# Patient Record
Sex: Female | Born: 1935 | Race: Asian | Hispanic: No | Smoking: Never smoker
Health system: Southern US, Community
[De-identification: ages and names within clinical notes are randomized; demographics above are authoritative.]

## PROBLEM LIST (undated history)

## (undated) DIAGNOSIS — K219 Gastro-esophageal reflux disease without esophagitis: Secondary | ICD-10-CM

## (undated) DIAGNOSIS — I10 Essential (primary) hypertension: Secondary | ICD-10-CM

## (undated) DIAGNOSIS — E7800 Pure hypercholesterolemia, unspecified: Secondary | ICD-10-CM

## (undated) HISTORY — PX: WRIST FRACTURE SURGERY: SHX121

---

## 2018-07-09 ENCOUNTER — Other Ambulatory Visit: Payer: Self-pay

## 2018-07-09 ENCOUNTER — Emergency Department (HOSPITAL_COMMUNITY): Payer: Medicare Other

## 2018-07-09 ENCOUNTER — Encounter (HOSPITAL_COMMUNITY): Payer: Self-pay

## 2018-07-09 ENCOUNTER — Emergency Department (HOSPITAL_COMMUNITY)
Admission: EM | Admit: 2018-07-09 | Discharge: 2018-07-09 | Disposition: A | Discharge status: Home / Self Care | Payer: Medicare Other | Attending: Emergency Medicine | Admitting: Emergency Medicine

## 2018-07-09 DIAGNOSIS — I10 Essential (primary) hypertension: Secondary | ICD-10-CM | POA: Diagnosis not present

## 2018-07-09 DIAGNOSIS — Z79899 Other long term (current) drug therapy: Secondary | ICD-10-CM | POA: Insufficient documentation

## 2018-07-09 DIAGNOSIS — E860 Dehydration: Secondary | ICD-10-CM | POA: Insufficient documentation

## 2018-07-09 DIAGNOSIS — R55 Syncope and collapse: Secondary | ICD-10-CM

## 2018-07-09 HISTORY — DX: Pure hypercholesterolemia, unspecified: E78.00

## 2018-07-09 HISTORY — DX: Essential (primary) hypertension: I10

## 2018-07-09 HISTORY — DX: Gastro-esophageal reflux disease without esophagitis: K21.9

## 2018-07-09 LAB — BASIC METABOLIC PANEL
ANION GAP: 9 (ref 5–15)
BUN: 19 mg/dL (ref 8–23)
CO2: 24 mmol/L (ref 22–32)
Calcium: 9.2 mg/dL (ref 8.9–10.3)
Chloride: 107 mmol/L (ref 98–111)
Creatinine, Ser: 0.92 mg/dL (ref 0.44–1.00)
GFR calc non Af Amer: 55 mL/min — ABNORMAL LOW (ref >60)
Glucose, Bld: 99 mg/dL (ref 70–99)
POTASSIUM: 3.8 mmol/L (ref 3.5–5.1)
SODIUM: 140 mmol/L (ref 135–145)

## 2018-07-09 LAB — CBC WITH DIFFERENTIAL/PLATELET
Abs Immature Granulocytes: 0 10*3/uL (ref 0.0–0.1)
BASOS ABS: 0 10*3/uL (ref 0.0–0.1)
Basophils Relative: 0 %
Eosinophils Absolute: 0.1 10*3/uL (ref 0.0–0.7)
Eosinophils Relative: 1 %
HCT: 41.9 % (ref 36.0–46.0)
Hemoglobin: 13.7 g/dL (ref 12.0–15.0)
IMMATURE GRANULOCYTES: 0 %
Lymphocytes Relative: 17 %
Lymphs Abs: 1.3 10*3/uL (ref 0.7–4.0)
MCH: 31.4 pg (ref 26.0–34.0)
MCHC: 32.7 g/dL (ref 30.0–36.0)
MCV: 95.9 fL (ref 78.0–100.0)
Monocytes Absolute: 0.5 10*3/uL (ref 0.1–1.0)
Monocytes Relative: 7 %
NEUTROS ABS: 5.4 10*3/uL (ref 1.7–7.7)
NEUTROS PCT: 75 %
Platelets: 229 10*3/uL (ref 150–400)
RBC: 4.37 MIL/uL (ref 3.87–5.11)
RDW: 12 % (ref 11.5–15.5)
WBC: 7.2 10*3/uL (ref 4.0–10.5)

## 2018-07-09 LAB — URINALYSIS, ROUTINE W REFLEX MICROSCOPIC
Bilirubin Urine: NEGATIVE
Glucose, UA: NEGATIVE mg/dL
Hgb urine dipstick: NEGATIVE
Ketones, ur: NEGATIVE mg/dL
LEUKOCYTES UA: NEGATIVE
NITRITE: NEGATIVE
Protein, ur: NEGATIVE mg/dL
Specific Gravity, Urine: 1.006 (ref 1.005–1.030)
pH: 7 (ref 5.0–8.0)

## 2018-07-09 LAB — TROPONIN I

## 2018-07-09 LAB — CBG MONITORING, ED: GLUCOSE-CAPILLARY: 89 mg/dL (ref 70–99)

## 2018-07-09 MED ORDER — SODIUM CHLORIDE 0.9 % IV BOLUS
500.0000 mL | Freq: Once | INTRAVENOUS | Status: AC
  Administered 2018-07-09: 500 mL via INTRAVENOUS

## 2018-07-09 MED ORDER — SODIUM CHLORIDE 0.9 % IV SOLN
INTRAVENOUS | Status: DC
  Administered 2018-07-09: 12:00:00 via INTRAVENOUS

## 2018-07-09 NOTE — ED Notes (Signed)
Patient transported to CT 

## 2018-07-09 NOTE — ED Notes (Signed)
Ambulated pt, down hallway and the pt was able to walk under her own power, felt like normal-per family. They stated that they think she didn't eat all day and feels weak as a direct result.

## 2018-07-09 NOTE — ED Provider Notes (Signed)
[facility] Provider Note   CSN: [id number] Arrival date & time: 07/09/18  1116     History   Chief Complaint Chief Complaint  Patient presents with  . Loss of Consciousness    HPI Rayne Dugnis Galambos is a 84 y.o. female.  HPI Patient presents to the emergency room for evaluation after a syncopal episode.  [place].  [place].  Patient states last evening at the wedding she had a near syncopal episode.  Patient started to feel lightheaded and felt like she was going to faint.  She was helped to the ground.  Patient did not lose consciousness.  Patient symptoms slowly resolved and she felt fine.  She did not have to leave the party.  This morning when the patient woke up she felt thirsty and wanted to get something to drink.  She was found at home on the ground where it looks like she had fallen off a chair.  When EMS arrived they noted that she was hypotensive with a blood pressure in the 80s.  Heart rate was 85-1 10.  Patient was given normal saline bolus with improvement.  Patient has a mild headache.  She denies any neck pain or chest pain.  No abdominal pain.  She has not had any vomiting or diarrhea.  No blood in her stool. Past Medical History:  Diagnosis Date  . GERD (gastroesophageal reflux disease)   . High cholesterol   . Hypertension     There are no active problems to display for this patient.   Past Surgical History:  Procedure Laterality Date  . WRIST FRACTURE SURGERY Right      OB History   None      Home Medications    Prior to Admission medications   Medication Sig Start Date End Date Taking? Authorizing Provider  amLODipine (NORVASC) 10 MG tablet Take 10 mg by mouth daily. 06/14/18  Yes [provider]  ELIQUIS 2.5 MG TABS tablet Take 2.5 mg by mouth 2 (two) times daily. 04/11/18  Yes [provider]  flecainide (TAMBOCOR) 50 MG tablet Take 50  mg by mouth 2 (two) times daily. 06/28/18  Yes [provider]  omeprazole (PRILOSEC) 40 MG capsule Take 40 mg by mouth daily. 04/21/18  Yes [provider]  simvastatin (ZOCOR) 10 MG tablet Take 10 mg by mouth daily. 07/02/18  Yes [provider]    Family History No family history on file.  Social History Social History   Tobacco Use  . Smoking status: Never Smoker  . Smokeless tobacco: Never Used  Substance Use Topics  . Alcohol use: Never    Frequency: Never  . Drug use: Never     Allergies   Patient has no known allergies.   Review of Systems Review of Systems  All other systems reviewed and are negative.    Physical Exam Updated Vital Signs BP 109/73   Pulse (!) 116   Temp 97.7 F (36.5 C) (Oral)   Resp (!) 23   Ht 1.524 m (5')   Wt 53.5 kg   SpO2 97%   BMI 23.05 kg/m   Physical Exam  Constitutional: She is oriented to person, place, and time. She appears well-developed and well-nourished. No distress.  HENT:  Head: Normocephalic and atraumatic.  Right Ear: External ear normal.  Left Ear: External ear normal.  Mouth/Throat: Oropharynx is clear and moist.  Mild  ttp left scalp  Eyes: Conjunctivae are normal. Right eye exhibits no discharge. Left eye exhibits no discharge. No scleral icterus.  Neck: Neck supple. No tracheal deviation present.  Cardiovascular: Normal rate, regular rhythm and intact distal pulses.  Pulmonary/Chest: Effort normal and breath sounds normal. No stridor. No respiratory distress. She has no wheezes. She has no rales.  Abdominal: Soft. Bowel sounds are normal. She exhibits no distension. There is no tenderness. There is no rebound and no guarding.  Musculoskeletal: She exhibits no edema or tenderness.  Neurological: She is alert and oriented to person, place, and time. She has normal strength. No cranial nerve deficit (No facial droop, extraocular movements intact, tongue midline ) or sensory deficit. She  exhibits normal muscle tone. She displays no seizure activity. Coordination normal.  No pronator drift bilateral upper extrem, able to hold both legs off bed for 5 seconds, sensation intact in all extremities, no visual field cuts, no left or right sided neglect, normal finger-nose exam bilaterally, no nystagmus noted   Skin: Skin is warm and dry. No rash noted.  Psychiatric: She has a normal mood and affect.  Nursing note and vitals reviewed.    ED Treatments / Results  Labs (all labs ordered are listed, but only abnormal results are displayed) Labs Reviewed  BASIC METABOLIC PANEL - Abnormal; Notable for the following components:      Result Value   GFR calc non Af Amer 55 (*)    All other components within normal limits  URINALYSIS, ROUTINE W REFLEX MICROSCOPIC - Abnormal; Notable for the following components:   Color, Urine STRAW (*)    All other components within normal limits  CBC WITH DIFFERENTIAL/PLATELET  TROPONIN I  CBG MONITORING, ED    EKG EKG Interpretation  Date/Time:  Sunday July 09 2018 11:26:18 EDT Ventricular Rate:  90 PR Interval:    QRS Duration: 91 QT Interval:  366 QTC Calculation: 448 R Axis:   16 Text Interpretation:  Atrial fibrillation Nonspecific repol abnormality, diffuse leads No old tracing to compare Confirmed by Linwood Dibbles [phone]) on 07/09/2018 11:33:31 AM   Radiology Ct Head Wo Contrast  Result Date: 07/09/2018 CLINICAL DATA:  Pt had syncopal episode with positive LOC. Pt hit left side of head and has hematoma to left occipital area. Pt denies being on blood thinners, EXAM: CT HEAD WITHOUT CONTRAST TECHNIQUE: Contiguous axial images were obtained from the base of the skull through the vertex without intravenous contrast. COMPARISON:  None. FINDINGS: Brain: No evidence of acute infarction, hemorrhage, hydrocephalus, extra-axial collection or mass lesion/mass effect. Mild subcortical white matter hypoattenuation consistent with chronic  microvascular ischemic change. Mild volume loss with normal range for age. Vascular: No hyperdense vessel or unexpected calcification. Skull: Normal. Negative for fracture or focal lesion. Sinuses/Orbits: Visualized globes and orbits are unremarkable visualized sinuses and mastoid air cells are clear. Other: None IMPRESSION: 1. No acute intracranial abnormalities. 2. Mild chronic microvascular ischemic change. Electronically Signed   By: Amie Portland M.D.   On: 07/09/2018 12:22    Procedures Procedures (including critical care time)  Medications Ordered in ED Medications  sodium chloride 0.9 % bolus 500 mL (0 mLs Intravenous Stopped 07/09/18 1320)    And  0.9 %  sodium chloride infusion ( Intravenous New Bag/Given 07/09/18 1219)  sodium chloride 0.9 % bolus 500 mL (0 mLs Intravenous Stopped 07/09/18 1608)     Initial Impression / Assessment and Plan / ED Course  I have reviewed the triage vital signs  and the nursing notes.  Pertinent labs & imaging results that were available during my care of the patient were reviewed by me and considered in my medical decision making (see chart for details).  Patient presented to the emergency room for evaluation after a syncopal episode.  Patient is here in [place] visiting for her grandsons wedding.  Patient does not think she ate or drank normally last evening.  She was very hot and may have gotten dehydrated.  This morning when she woke up she was thirsty and going to get something to drink when she had another episode.  Patient does have known A. fib.  Her EKG did show atrial fibrillation.  Her ED work-up is reassuring.  No signs of severe dehydration.  Cardiac enzymes are normal.  CT scan does not show any acute injuries.  Patient was noted to be orthostatic.  She was given IV fluids and her symptoms have improved.  I discussed admitting her to the hospital for observation considering her age and the recurrent episodes.  Patient did not want to be  admitted to the hospital.  She was feeling better.  She and her family are comfortable with being discharged home at this point.  She was able to walk around the emergency room without any recurrent dizziness or lightheadedness.  Final Clinical Impressions(s) / ED Diagnoses   Final diagnoses:  Dehydration  Syncope, unspecified syncope type    ED Discharge Orders    None       Linwood Dibbles, MD 07/09/18 1645

## 2018-07-09 NOTE — ED Notes (Signed)
Pt requested to walk, did ortho vitals and pt became dizzy, notified MD. VS validated. BP dropped to 98

## 2018-07-09 NOTE — ED Notes (Signed)
ED Provider at bedside. 

## 2018-07-09 NOTE — Discharge Instructions (Addendum)
Drink plenty of fluids, follow up with your cardiologist when you return home, return for worsening symptoms

## 2018-07-09 NOTE — ED Triage Notes (Signed)
[facility] after syncopal episode with positive LOC. Pt hit left side of head and has hematoma to left occipital area. Pt denies being on blood thinners, A&O upon arrival to ED. Pt had syncopal episode last night, was evaluated by family, and given fluids. Pt had 2nd syncopal episode today. EMS reports initial HR 85-110 with BP of 82/46. After 250 NS IV, HR slowed to 65-84 with pressure of 123/66 sitting and then 124/75 standing. When standing HR elevated to 118.

## 2019-10-16 IMAGING — CT CT HEAD W/O CM
3 series · 15 of 47 positions shown, 18 images · non-contrast
Comparison: None.

CLINICAL DATA: Pt had syncopal episode with positive LOC. Pt hit
left side of head and has hematoma to left occipital area. Pt denies
being on blood thinners,

EXAM:
CT HEAD WITHOUT CONTRAST
TECHNIQUE: Contiguous axial images were obtained from the base of the skull
through the vertex without intravenous contrast.

[Series 3: head 5.0 h30s · axial · 0.39mm/px · z∈[-135,-10]mm · 9 of 31 slices shown, 12 images]
[im 3/31  brain]
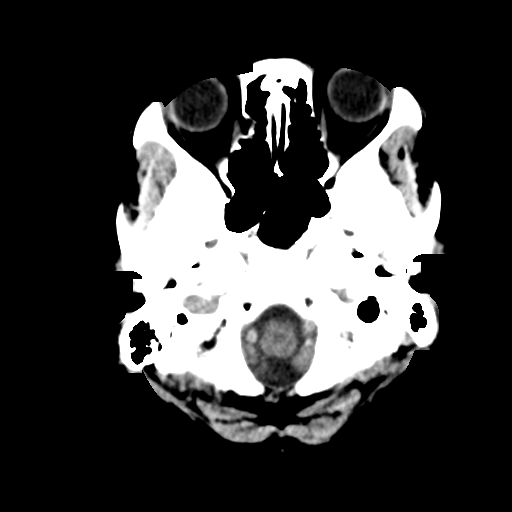
[im 3/31  bone]
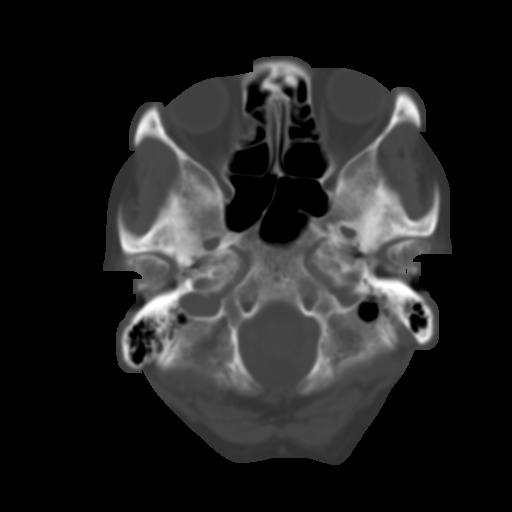
[im 6/31  brain]
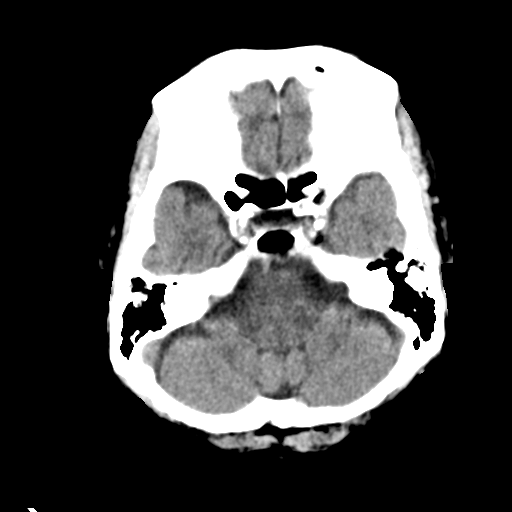
[im 9/31  brain]
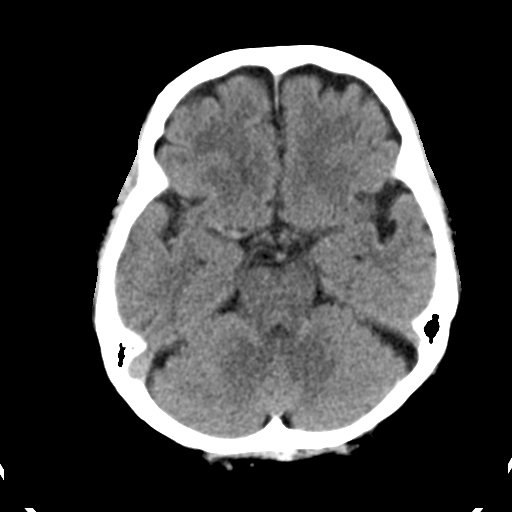
[im 12/31  brain]
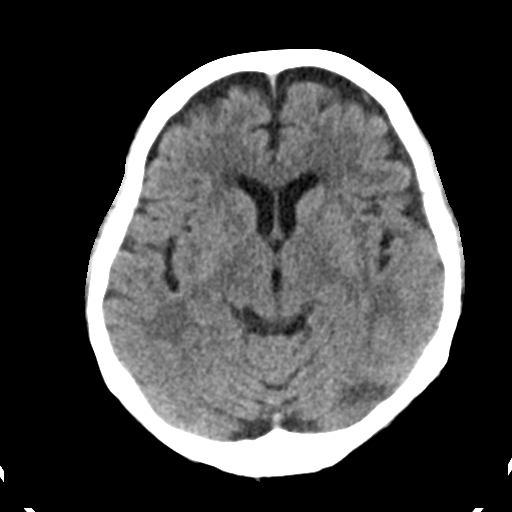
[im 16/31  brain]
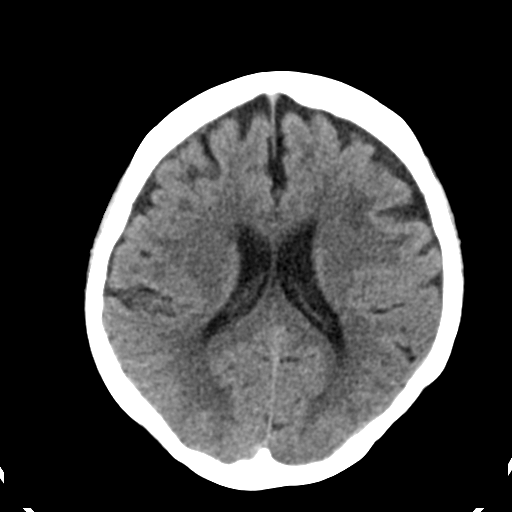
[im 16/31  bone]
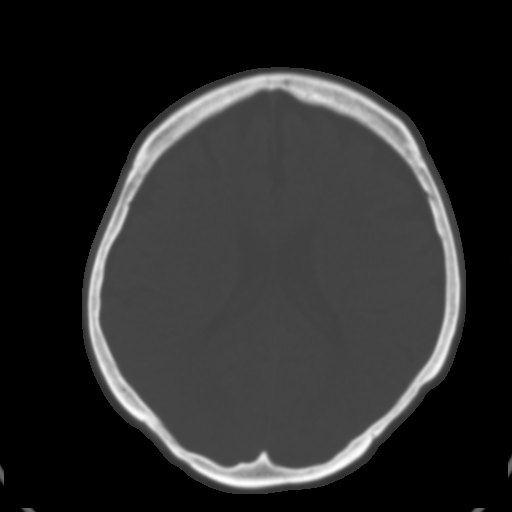
[im 19/31  brain]
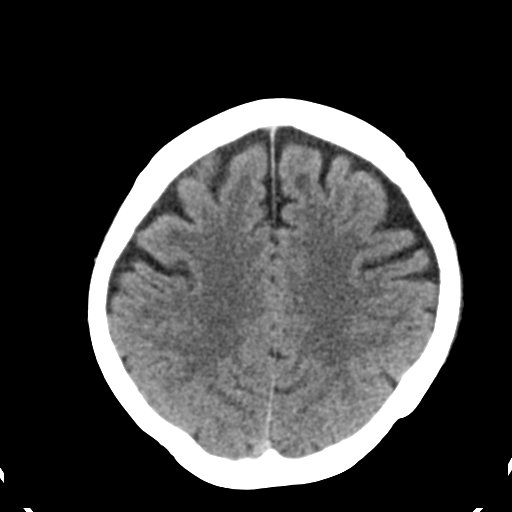
[im 22/31  brain]
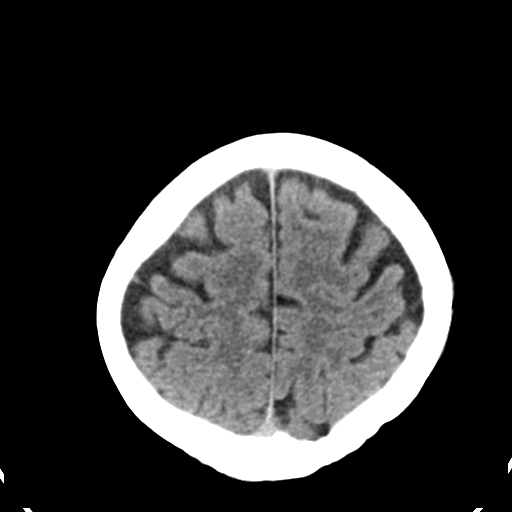
[im 25/31  brain]
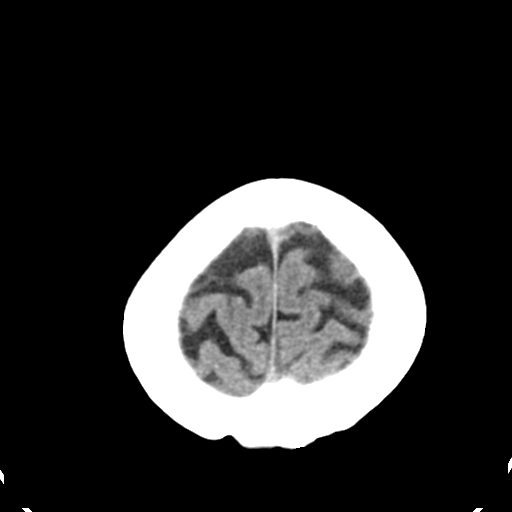
[im 28/31  brain]
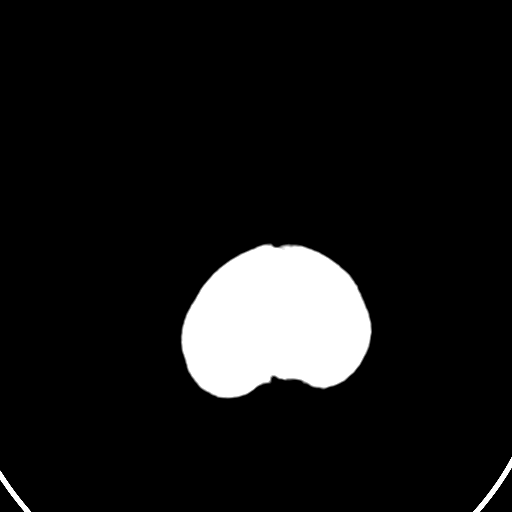
[im 28/31  bone]
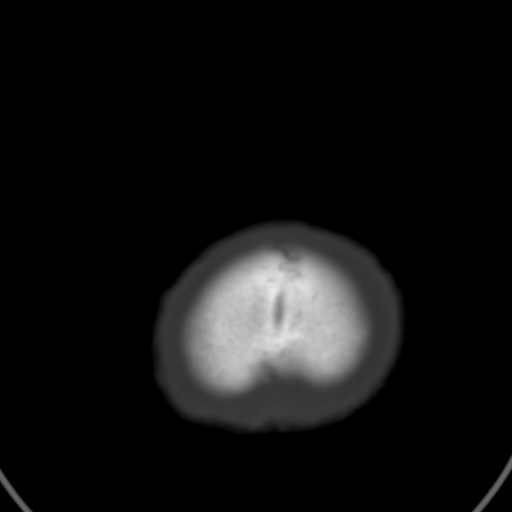

[Series 5: head 3.0 mpr cor · coronal · 0.29mm/px · 3 of 67 slices shown]
[im 23/67  brain]
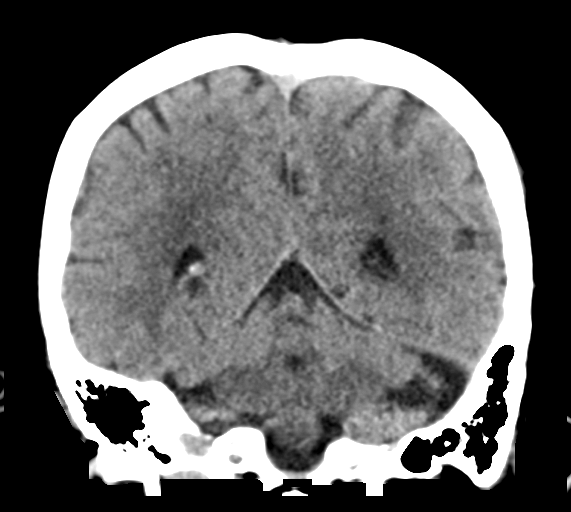
[im 30/67  brain]
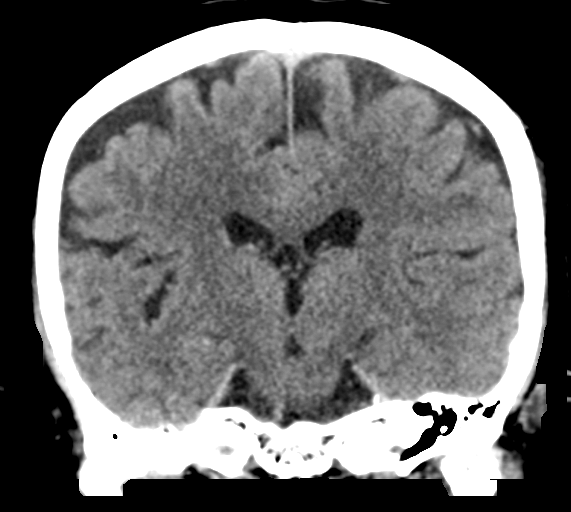
[im 37/67  brain]
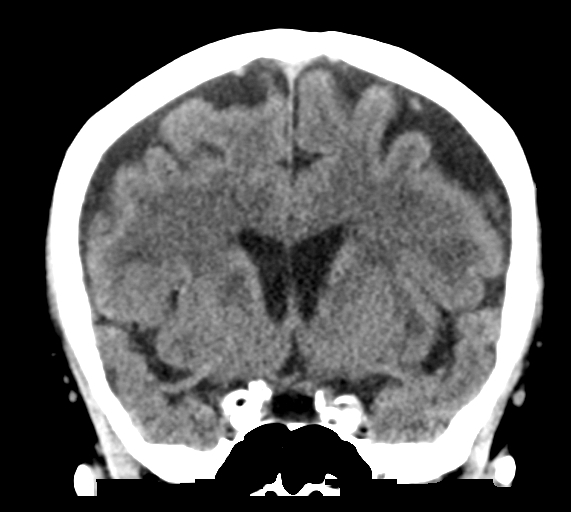

[Series 6: head 3.0 mpr sag · sagittal · 0.29mm/px · 3 of 57 slices shown]
[im 19/57  brain]
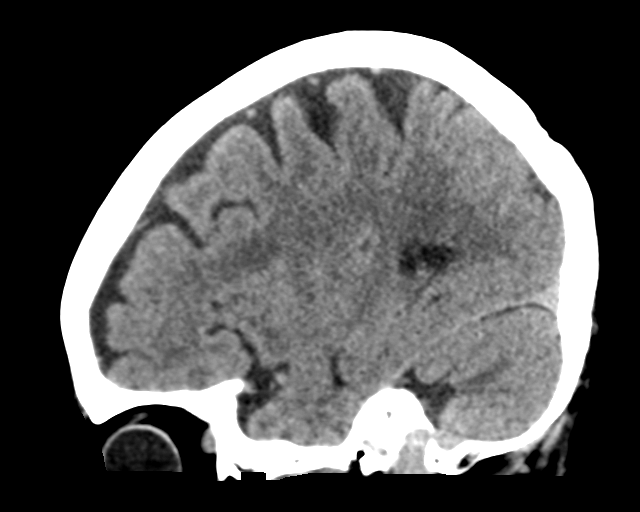
[im 29/57  brain]
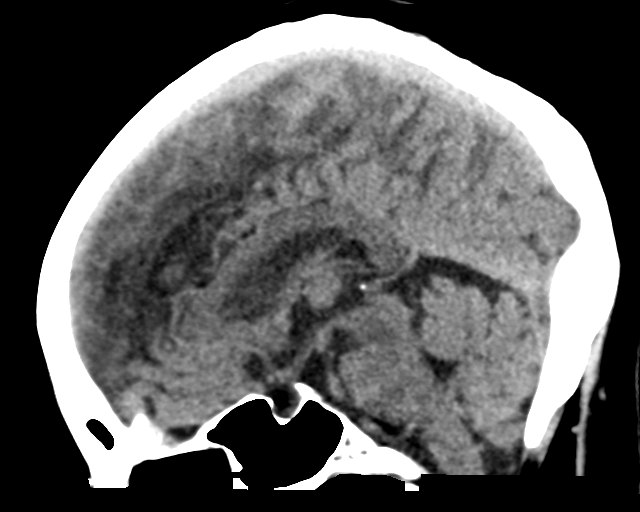
[im 38/57  brain]
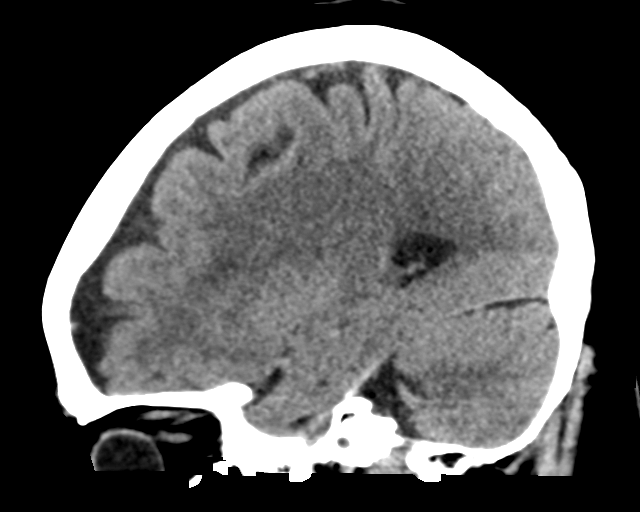

[15 of 47 positions shown; findings below may reference images not displayed]

FINDINGS: Brain: No evidence of acute infarction, hemorrhage, hydrocephalus,
extra-axial collection or mass lesion/mass effect.

Mild subcortical white matter hypoattenuation consistent with
chronic microvascular ischemic change. Mild volume loss with normal
range for age.

Vascular: No hyperdense vessel or unexpected calcification.

Skull: Normal. Negative for fracture or focal lesion.

Sinuses/Orbits: Visualized globes and orbits are unremarkable
visualized sinuses and mastoid air cells are clear.

Other: None
IMPRESSION: 1. No acute intracranial abnormalities.
2. Mild chronic microvascular ischemic change.
